# Patient Record
Sex: Female | Born: 1985 | Race: White | Hispanic: Yes | Marital: Single | Smoking: Never smoker
Health system: Southern US, Community
[De-identification: ages and names within clinical notes are randomized; demographics above are authoritative.]

## PROBLEM LIST (undated history)

## (undated) ENCOUNTER — Inpatient Hospital Stay (HOSPITAL_COMMUNITY): Payer: Self-pay

## (undated) DIAGNOSIS — B019 Varicella without complication: Secondary | ICD-10-CM

## (undated) DIAGNOSIS — N390 Urinary tract infection, site not specified: Secondary | ICD-10-CM

## (undated) DIAGNOSIS — Z789 Other specified health status: Secondary | ICD-10-CM

## (undated) HISTORY — PX: NO PAST SURGERIES: SHX2092

---

## 2010-10-20 ENCOUNTER — Emergency Department (HOSPITAL_COMMUNITY)
Admission: EM | Admit: 2010-10-20 | Discharge: 2010-10-20 | Payer: Self-pay | Source: Home / Self Care | Admitting: Family Medicine

## 2010-11-19 ENCOUNTER — Emergency Department (HOSPITAL_COMMUNITY): Admission: EM | Admit: 2010-11-19 | Discharge: 2010-11-19 | Payer: Self-pay | Admitting: Family Medicine

## 2011-03-05 LAB — POCT URINALYSIS DIPSTICK
Bilirubin Urine: NEGATIVE
Glucose, UA: NEGATIVE mg/dL
Nitrite: NEGATIVE
Urobilinogen, UA: 0.2 mg/dL (ref 0.0–1.0)

## 2011-03-05 LAB — POCT PREGNANCY, URINE: Preg Test, Ur: NEGATIVE

## 2011-03-06 LAB — POCT URINALYSIS DIPSTICK
Bilirubin Urine: NEGATIVE
Glucose, UA: NEGATIVE mg/dL
Nitrite: NEGATIVE
Urobilinogen, UA: 0.2 mg/dL (ref 0.0–1.0)

## 2011-03-06 LAB — GC/CHLAMYDIA PROBE AMP, GENITAL
Chlamydia, DNA Probe: NEGATIVE
GC Probe Amp, Genital: NEGATIVE

## 2011-03-06 LAB — POCT PREGNANCY, URINE: Preg Test, Ur: NEGATIVE

## 2011-06-30 ENCOUNTER — Inpatient Hospital Stay (HOSPITAL_COMMUNITY)
Admission: AD | Admit: 2011-06-30 | Discharge: 2011-06-30 | Disposition: A | Discharge status: Home / Self Care | Payer: Self-pay | Source: Ambulatory Visit | Attending: Obstetrics & Gynecology | Admitting: Obstetrics & Gynecology

## 2011-06-30 ENCOUNTER — Inpatient Hospital Stay (HOSPITAL_COMMUNITY): Payer: Self-pay | Admitting: Obstetrics & Gynecology

## 2011-06-30 ENCOUNTER — Inpatient Hospital Stay (HOSPITAL_COMMUNITY): Payer: Self-pay

## 2011-06-30 ENCOUNTER — Encounter (HOSPITAL_COMMUNITY): Payer: Self-pay

## 2011-06-30 DIAGNOSIS — O209 Hemorrhage in early pregnancy, unspecified: Secondary | ICD-10-CM

## 2011-06-30 DIAGNOSIS — O200 Threatened abortion: Secondary | ICD-10-CM | POA: Insufficient documentation

## 2011-06-30 HISTORY — DX: Other specified health status: Z78.9

## 2011-06-30 LAB — URINALYSIS, ROUTINE W REFLEX MICROSCOPIC
Glucose, UA: NEGATIVE mg/dL
Leukocytes, UA: NEGATIVE
Protein, ur: NEGATIVE mg/dL
Specific Gravity, Urine: 1.01 (ref 1.005–1.030)

## 2011-06-30 LAB — URINE MICROSCOPIC-ADD ON

## 2011-06-30 LAB — CBC
MCH: 28.2 pg (ref 26.0–34.0)
MCV: 82.2 fL (ref 78.0–100.0)
Platelets: 277 10*3/uL (ref 150–400)
RBC: 4.65 MIL/uL (ref 3.87–5.11)
RDW: 14.6 % (ref 11.5–15.5)
WBC: 8.6 10*3/uL (ref 4.0–10.5)

## 2011-06-30 LAB — HCG, QUANTITATIVE, PREGNANCY: hCG, Beta Chain, Quant, S: 10027 m[IU]/mL — ABNORMAL HIGH (ref <5)

## 2011-06-30 LAB — WET PREP, GENITAL
Clue Cells Wet Prep HPF POC: NONE SEEN
Trich, Wet Prep: NONE SEEN

## 2011-06-30 LAB — TYPE AND SCREEN: Antibody Screen: NEGATIVE

## 2011-06-30 NOTE — Progress Notes (Signed)
Mayer Camel, NP at bedside.  US done at bedside per NP.  See orders.

## 2011-06-30 NOTE — Progress Notes (Signed)
Pt given discharge instructions with interpreter at bedside.  Instructed to return on 07/02/2011 for repeat lab work.  Pt verbalized understanding.

## 2011-06-30 NOTE — ED Provider Notes (Signed)
History     Chief Complaint  Patient presents with  . Vaginal Bleeding    onset last night pink today red, wearing a pad   Patient is a 25 y.o. female presenting with vaginal bleeding. The history is provided by the patient. The history is limited by a language barrier. A language interpreter was used.  Vaginal Bleeding This is a new problem. The current episode started today. The problem occurs intermittently. The problem has been unchanged. Associated symptoms include abdominal pain. Pertinent negatives include no chills, coughing, fever, headaches, nausea or vomiting. The symptoms are aggravated by nothing. She has tried nothing for the symptoms.      Past Medical History  Diagnosis Date  . No pertinent past medical history     Past Surgical History  Procedure Date  . No past surgeries     No family history on file.  History  Substance Use Topics  . Smoking status: Never Smoker   . Smokeless tobacco: Not on file  . Alcohol Use: No    Allergies: No Known Allergies  Prescriptions prior to admission  Medication Sig Dispense Refill  . prenatal vitamin w/FE, FA (PRENATAL 1 + 1) 27-1 MG TABS Take 1 tablet by mouth daily.          Review of Systems  Constitutional: Negative for fever and chills.  Respiratory: Negative for cough.   Gastrointestinal: Positive for abdominal pain. Negative for nausea and vomiting.  Genitourinary: Positive for vaginal bleeding.       Vaginal bleeding  Neurological: Negative for headaches.   Physical Exam   Blood pressure 115/70, pulse 74, temperature 99.3 F (37.4 C), temperature source Oral, resp. rate 16, height 4\' 11"  (1.499 m), weight 109 lb (49.442 kg), last menstrual period 04/12/2011.  Physical Exam  Constitutional: She appears well-developed and well-nourished.  HENT:  Head: Normocephalic.  Neck: Neck supple.  GI: Soft. There is no tenderness.  Genitourinary: Uterus is not enlarged. Cervix exhibits no motion tenderness.  Right adnexum displays no tenderness. There is bleeding around the vagina.  Musculoskeletal: Normal range of motion.    MAU Course  Procedures  MDM Discussed with patient ultrasound results and prob. failed pregnancy. This is a much wanted pregnancy and patient request f/u [facility] and expectant management. Pt. To return in 2 days.. She will return sooner for heavy bleeding, severe pain or other problems.    [place], [place] 06/30/11 2207

## 2011-06-30 NOTE — Progress Notes (Signed)
Onset of pink bleeding last night and today red today has not changed a pad, earlier had some side pain, none at present LMP 04/12/11

## 2011-06-30 NOTE — Progress Notes (Signed)
Interpretor at bedside.  poc discussed. Pt verbalized understanding.

## 2011-06-30 NOTE — Progress Notes (Signed)
Mayer Camel, NP in with pt.  poc discussed.  Pelvic done.  Wet prep and cultures collected.

## 2011-06-30 NOTE — Initial Assessments (Signed)
Pt presents with complaint of onset of vaginal bleeding yesterday. States she did not have a period in April or May and had positive preg test at home in May. States she is changing a pad q 30 minutes, denies pain.

## 2011-07-01 LAB — GC/CHLAMYDIA PROBE AMP, GENITAL
Chlamydia, DNA Probe: NEGATIVE
GC Probe Amp, Genital: NEGATIVE

## 2011-07-02 ENCOUNTER — Inpatient Hospital Stay (HOSPITAL_COMMUNITY)
Admission: AD | Admit: 2011-07-02 | Discharge: 2011-07-02 | Disposition: A | Discharge status: Home / Self Care | Payer: Self-pay | Source: Ambulatory Visit | Attending: Obstetrics & Gynecology | Admitting: Obstetrics & Gynecology

## 2011-07-02 ENCOUNTER — Inpatient Hospital Stay (HOSPITAL_COMMUNITY): Admission: AD | Admit: 2011-07-02 | Payer: Self-pay | Source: Ambulatory Visit | Admitting: Obstetrics & Gynecology

## 2011-07-02 ENCOUNTER — Inpatient Hospital Stay (HOSPITAL_COMMUNITY): Payer: Self-pay

## 2011-07-02 ENCOUNTER — Encounter (HOSPITAL_COMMUNITY): Payer: Self-pay | Admitting: *Deleted

## 2011-07-02 DIAGNOSIS — O039 Complete or unspecified spontaneous abortion without complication: Secondary | ICD-10-CM | POA: Insufficient documentation

## 2011-07-02 LAB — DIFFERENTIAL
Basophils Absolute: 0.1 10*3/uL (ref 0.0–0.1)
Basophils Relative: 0 % (ref 0–1)
Eosinophils Absolute: 0.3 10*3/uL (ref 0.0–0.7)
Neutro Abs: 7.7 10*3/uL (ref 1.7–7.7)
Neutrophils Relative %: 69 % (ref 43–77)

## 2011-07-02 LAB — CBC
MCH: 28.2 pg (ref 26.0–34.0)
MCHC: 33.8 g/dL (ref 30.0–36.0)
Platelets: 267 10*3/uL (ref 150–400)

## 2011-07-02 LAB — HCG, QUANTITATIVE, PREGNANCY: hCG, Beta Chain, Quant, S: 8244 m[IU]/mL — ABNORMAL HIGH (ref <5)

## 2011-07-02 MED ORDER — HYDROCODONE-ACETAMINOPHEN 5-500 MG PO TABS: 1.0000 | ORAL_TABLET | Freq: Four times a day (QID) | ORAL | Status: AC | PRN

## 2011-07-02 MED ORDER — METHYLERGONOVINE MALEATE 0.2 MG/ML IJ SOLN
0.2000 mg | Freq: Once | INTRAMUSCULAR | Status: AC
  Administered 2011-07-02: 0.2 mg via INTRAMUSCULAR
  Filled 2011-07-02: qty 1

## 2011-07-02 MED ORDER — HYDROMORPHONE HCL 1 MG/ML IJ SOLN
2.0000 mg | Freq: Once | INTRAMUSCULAR | Status: AC
  Administered 2011-07-02: 2 mg via INTRAMUSCULAR
  Filled 2011-07-02: qty 2

## 2011-07-02 MED ORDER — LACTATED RINGERS IV SOLN
Freq: Once | INTRAVENOUS | Status: AC
  Administered 2011-07-02: 10:00:00 via INTRAVENOUS

## 2011-07-02 NOTE — Progress Notes (Signed)
07/02/2011 Hillery Zachman  Interpreter  I assisted Darl Pikes RN with interpretation of plan of care

## 2011-07-02 NOTE — ED Notes (Signed)
Pt. Became dizzy and somewhat syncopal in U/S. Was returned to MAU on stretcher. Was conscious and oriented. Bleeding was minimal. E. Rice, PA observed pt. In room.

## 2011-07-02 NOTE — Progress Notes (Signed)
Bleeding started 4 days ago, became heavy today. Cramping started today.

## 2011-07-02 NOTE — Progress Notes (Signed)
07/02/2011 Amber Jacobson  Interpreter   I assisted Darl Pikes RN with patient discharge.

## 2011-07-02 NOTE — ED Provider Notes (Signed)
History   Pt is a 25y.o hispanic female who presents this am c/o severe lower abd pain and heavy vag bleeding that began around 9am. She denies fever or any other sx at this time. Pt was seen on 06/30/11 for mild vag bleeding and was noted at that time to have a probable failed pregnancy.  No chief complaint on file.  HPI  OB History    Grav Para Term Preterm Abortions TAB SAB Ect Mult Living   1               Past Medical History  Diagnosis Date  . No pertinent past medical history     Past Surgical History  Procedure Date  . No past surgeries     No family history on file.  History  Substance Use Topics  . Smoking status: Never Smoker   . Smokeless tobacco: Not on file  . Alcohol Use: No    Allergies: No Known Allergies  Prescriptions prior to admission  Medication Sig Dispense Refill  . prenatal vitamin w/FE, FA (PRENATAL 1 + 1) 27-1 MG TABS Take 1 tablet by mouth daily.          Review of Systems  Constitutional: Negative for fever and chills.  Cardiovascular: Negative for chest pain.  Gastrointestinal: Positive for abdominal pain. Negative for nausea and vomiting.  Genitourinary: Negative for dysuria, urgency, frequency and hematuria.  Neurological: Negative.  Negative for headaches.  Psychiatric/Behavioral: Negative for depression and suicidal ideas.   Physical Exam   Blood pressure 136/92, pulse 103, temperature 98.4 F (36.9 C), resp. rate 20, last menstrual period 04/12/2011.  Physical Exam  Nursing note and vitals reviewed. Constitutional: She appears well-developed and well-nourished. She appears distressed.  GI: Soft. She exhibits no distension. There is tenderness. There is no rebound and no guarding.  Genitourinary: There is bleeding (Heavy vag bleeding noted on exam.) around the vagina. No vaginal discharge found.    MAU Course  Procedures  [place] reviewed and demonstrates no IUP, gestational sac, of POC. Pt appears to have a completed AB  Pt  was given an IM injection of methergine.  A/P: 1) Complete AB: pt will be dc'd to home with an Rx for lortab. She will f/u in the gyn clinic. The interpreter has given pt all instructions. Discussed diet, activity, risks, and precautions.    Amber Jacobson, [place] 07/02/11 1205

## 2011-07-02 NOTE — ED Notes (Signed)
Pt. Brought to room #7. E. Rice, PA in to assess pain and bleeding.

## 2011-07-02 NOTE — ED Notes (Signed)
Jenean Lindau, PA at bedside. Minimal bleeding since arrival.

## 2011-07-02 NOTE — ED Notes (Signed)
Peripad changed prior to transport to U/S. Small amt,. Bleeding, but active trickle noted.

## 2011-07-24 ENCOUNTER — Encounter: Payer: Self-pay | Admitting: Advanced Practice Midwife

## 2011-12-24 NOTE — L&D Delivery Note (Signed)
Delivery Note At 11:08 PM a viable female was delivered via Vaginal, Spontaneous Delivery (Presentation: Left Occiput Anterior).  APGAR: 8, 9; weight 6 lb 10.9 oz (3030 g).   Placenta status: delivered with cord traction; membrane fragments retrieved from the cervix/LUS.  Cord: 3 vessels with the following complications: None.    Anesthesia: Epidural, local  Episiotomy: None Lacerations: 2nd degree;Perineal Suture Repair: 2.0 3.0 vicryl vicryl rapide Est. Blood Loss (mL): 300 ml  Mom to postpartum.  Baby to nursery-stable.  JACKSON-MOORE,Linetta Regner A 07/19/2012, 11:38 PM

## 2012-01-28 LAB — OB RESULTS CONSOLE HEPATITIS B SURFACE ANTIGEN: Hepatitis B Surface Ag: NEGATIVE

## 2012-01-28 LAB — OB RESULTS CONSOLE ABO/RH: RH Type: POSITIVE

## 2012-01-28 LAB — OB RESULTS CONSOLE RUBELLA ANTIBODY, IGM: Rubella: IMMUNE

## 2012-01-28 LAB — OB RESULTS CONSOLE ANTIBODY SCREEN: Antibody Screen: NEGATIVE

## 2012-01-28 LAB — OB RESULTS CONSOLE GC/CHLAMYDIA: Chlamydia: NEGATIVE

## 2012-07-19 ENCOUNTER — Inpatient Hospital Stay (HOSPITAL_COMMUNITY)
Admission: AD | Admit: 2012-07-19 | Discharge: 2012-07-21 | DRG: 775 | Disposition: A | Discharge status: Home / Self Care | Payer: Medicaid Other | Source: Ambulatory Visit | Attending: Obstetrics | Admitting: Obstetrics

## 2012-07-19 ENCOUNTER — Inpatient Hospital Stay (HOSPITAL_COMMUNITY): Payer: Medicaid Other | Admitting: Anesthesiology

## 2012-07-19 ENCOUNTER — Encounter (HOSPITAL_COMMUNITY): Payer: Self-pay | Admitting: *Deleted

## 2012-07-19 ENCOUNTER — Encounter (HOSPITAL_COMMUNITY): Payer: Self-pay | Admitting: Anesthesiology

## 2012-07-19 ENCOUNTER — Encounter (HOSPITAL_COMMUNITY): Payer: Self-pay

## 2012-07-19 DIAGNOSIS — O139 Gestational [pregnancy-induced] hypertension without significant proteinuria, unspecified trimester: Principal | ICD-10-CM | POA: Diagnosis present

## 2012-07-19 HISTORY — DX: Urinary tract infection, site not specified: N39.0

## 2012-07-19 HISTORY — DX: Varicella without complication: B01.9

## 2012-07-19 LAB — COMPREHENSIVE METABOLIC PANEL
Albumin: 2.5 g/dL — ABNORMAL LOW (ref 3.5–5.2)
Alkaline Phosphatase: 248 U/L — ABNORMAL HIGH (ref 39–117)
BUN: 10 mg/dL (ref 6–23)
CO2: 20 mEq/L (ref 19–32)
Chloride: 102 mEq/L (ref 96–112)
Glucose, Bld: 84 mg/dL (ref 70–99)
Potassium: 4.1 mEq/L (ref 3.5–5.1)
Total Bilirubin: 0.7 mg/dL (ref 0.3–1.2)

## 2012-07-19 LAB — RPR: RPR Ser Ql: NONREACTIVE

## 2012-07-19 LAB — LACTATE DEHYDROGENASE: LDH: 222 U/L (ref 94–250)

## 2012-07-19 LAB — CBC
Hemoglobin: 9.3 g/dL — ABNORMAL LOW (ref 12.0–15.0)
MCH: 20.7 pg — ABNORMAL LOW (ref 26.0–34.0)
MCHC: 29.5 g/dL — ABNORMAL LOW (ref 30.0–36.0)

## 2012-07-19 LAB — POCT FERN TEST

## 2012-07-19 LAB — ABO/RH: ABO/RH(D): O POS

## 2012-07-19 LAB — PREPARE RBC (CROSSMATCH)

## 2012-07-19 MED ORDER — LACTATED RINGERS IV SOLN
500.0000 mL | INTRAVENOUS | Status: DC | PRN
Start: 2012-07-19 — End: 2012-07-20
  Administered 2012-07-19: 500 mL via INTRAVENOUS

## 2012-07-19 MED ORDER — PHENYLEPHRINE 40 MCG/ML (10ML) SYRINGE FOR IV PUSH (FOR BLOOD PRESSURE SUPPORT): 80.0000 ug | PREFILLED_SYRINGE | INTRAVENOUS | Status: DC | PRN

## 2012-07-19 MED ORDER — LACTATED RINGERS IV SOLN: 500.0000 mL | Freq: Once | INTRAVENOUS | Status: DC

## 2012-07-19 MED ORDER — LIDOCAINE HCL (PF) 1 % IJ SOLN
30.0000 mL | INTRAMUSCULAR | Status: DC | PRN
  Administered 2012-07-19: 30 mL via SUBCUTANEOUS
  Filled 2012-07-19: qty 30

## 2012-07-19 MED ORDER — PHENYLEPHRINE 40 MCG/ML (10ML) SYRINGE FOR IV PUSH (FOR BLOOD PRESSURE SUPPORT)
80.0000 ug | PREFILLED_SYRINGE | INTRAVENOUS | Status: DC | PRN
  Filled 2012-07-19: qty 5

## 2012-07-19 MED ORDER — EPHEDRINE 5 MG/ML INJ
10.0000 mg | INTRAVENOUS | Status: DC | PRN
  Filled 2012-07-19: qty 4

## 2012-07-19 MED ORDER — IBUPROFEN 600 MG PO TABS
600.0000 mg | ORAL_TABLET | Freq: Four times a day (QID) | ORAL | Status: DC | PRN
  Administered 2012-07-20: 600 mg via ORAL
  Filled 2012-07-19: qty 1

## 2012-07-19 MED ORDER — LIDOCAINE HCL (PF) 1 % IJ SOLN
INTRAMUSCULAR | Status: DC | PRN
  Administered 2012-07-19 (×2): 8 mL

## 2012-07-19 MED ORDER — ACETAMINOPHEN 325 MG PO TABS: 650.0000 mg | ORAL_TABLET | ORAL | Status: DC | PRN

## 2012-07-19 MED ORDER — OXYCODONE-ACETAMINOPHEN 5-325 MG PO TABS: 1.0000 | ORAL_TABLET | ORAL | Status: DC | PRN

## 2012-07-19 MED ORDER — DIPHENHYDRAMINE HCL 50 MG/ML IJ SOLN: 12.5000 mg | INTRAMUSCULAR | Status: DC | PRN

## 2012-07-19 MED ORDER — LACTATED RINGERS IV SOLN
INTRAVENOUS | Status: DC
  Administered 2012-07-19 (×2): via INTRAVENOUS
  Administered 2012-07-19: 125 mL/h via INTRAVENOUS

## 2012-07-19 MED ORDER — BUTORPHANOL TARTRATE 1 MG/ML IJ SOLN: 1.0000 mg | INTRAMUSCULAR | Status: DC | PRN

## 2012-07-19 MED ORDER — FLEET ENEMA 7-19 GM/118ML RE ENEM: 1.0000 | ENEMA | RECTAL | Status: DC | PRN

## 2012-07-19 MED ORDER — OXYTOCIN BOLUS FROM INFUSION
250.0000 mL | Freq: Once | INTRAVENOUS | Status: DC
  Filled 2012-07-19: qty 500

## 2012-07-19 MED ORDER — TERBUTALINE SULFATE 1 MG/ML IJ SOLN: 0.2500 mg | Freq: Once | INTRAMUSCULAR | Status: AC | PRN

## 2012-07-19 MED ORDER — ONDANSETRON HCL 4 MG/2ML IJ SOLN: 4.0000 mg | Freq: Four times a day (QID) | INTRAMUSCULAR | Status: DC | PRN

## 2012-07-19 MED ORDER — EPHEDRINE 5 MG/ML INJ: 10.0000 mg | INTRAVENOUS | Status: DC | PRN

## 2012-07-19 MED ORDER — OXYTOCIN 40 UNITS IN LACTATED RINGERS INFUSION - SIMPLE MED
62.5000 mL/h | Freq: Once | INTRAVENOUS | Status: AC
  Administered 2012-07-19: 62.5 mL/h via INTRAVENOUS

## 2012-07-19 MED ORDER — NITROFURANTOIN MONOHYD MACRO 100 MG PO CAPS
100.0000 mg | ORAL_CAPSULE | Freq: Two times a day (BID) | ORAL | Status: DC
  Administered 2012-07-19: 100 mg via ORAL
  Filled 2012-07-19 (×3): qty 1

## 2012-07-19 MED ORDER — FENTANYL 2.5 MCG/ML BUPIVACAINE 1/10 % EPIDURAL INFUSION (WH - ANES)
INTRAMUSCULAR | Status: DC | PRN
  Administered 2012-07-19: 14 mL/h via EPIDURAL
  Administered 2012-07-19: 14 mL/h

## 2012-07-19 MED ORDER — OXYTOCIN 40 UNITS IN LACTATED RINGERS INFUSION - SIMPLE MED
1.0000 m[IU]/min | INTRAVENOUS | Status: DC
  Administered 2012-07-19: 2 m[IU]/min via INTRAVENOUS
  Filled 2012-07-19: qty 1000

## 2012-07-19 MED ORDER — CITRIC ACID-SODIUM CITRATE 334-500 MG/5ML PO SOLN: 30.0000 mL | ORAL | Status: DC | PRN

## 2012-07-19 MED ORDER — FENTANYL 2.5 MCG/ML BUPIVACAINE 1/10 % EPIDURAL INFUSION (WH - ANES)
14.0000 mL/h | INTRAMUSCULAR | Status: DC
  Filled 2012-07-19 (×2): qty 60

## 2012-07-19 NOTE — Progress Notes (Addendum)
Amber Jacobson is a 26 y.o. G2P0010 at [redacted]w[redacted]d by LMP admitted for active labor, PIH  Subjective: Comfortable  Objective: BP 132/85  Pulse 92  Temp 97.8 F (36.6 C) (Oral)  Resp 20  Ht 4\' 11"  (1.499 m)  Wt 71.668 kg (158 lb)  BMI 31.91 kg/m2  SpO2 99%  LMP 04/12/2011      FHT:  FHR: 140 bpm, variability: moderate,  accelerations:  Present,  decelerations:  Absent UC:   regular, every 5 minutes SVE:   Dilation: 6.5 Effacement (%): 80 Station: -1 Exam by:: dr. Enrigue Catena IUPC placed  Labs: Lab Results  Component Value Date   WBC 10.2 07/19/2012   HGB 9.3* 07/19/2012   HCT 31.5* 07/19/2012   MCV 70.2* 07/19/2012   PLT 411* 07/19/2012    Assessment / Plan: Protracted active phase  Labor: Will monitor MVU/titrate Pitocin-->300 MVU Preeclampsia:  BPs OK Fetal Wellbeing:  Category I Pain Control:  Epidural I/D:  n/a Anticipated MOD:  NSVD  JACKSON-MOORE,Dareen Gutzwiller A 07/19/2012, 5:26 PM

## 2012-07-19 NOTE — H&P (Signed)
Amber Jacobson is a 26 y.o. female presenting for contractions. Maternal Medical History:  Reason for admission: Reason for admission: contractions.  Contractions: Frequency: regular.   Perceived severity is moderate.    Fetal activity: Perceived fetal activity is normal.    Prenatal complications: no prenatal complications   OB History    Grav Para Term Preterm Abortions TAB SAB Ect Mult Living   2    1  1         Past Medical History  Diagnosis Date  . No pertinent past medical history   . Varicella   . UTI (lower urinary tract infection)    Past Surgical History  Procedure Date  . No past surgeries    Family History: family history is not on file. Social History:  reports that she has never smoked. She does not have any smokeless tobacco history on file. She reports that she does not drink alcohol or use illicit drugs.     Review of Systems  Constitutional: Negative for fever.  Eyes: Negative for blurred vision.  Respiratory: Negative for shortness of breath.   Gastrointestinal: Negative for vomiting.  Skin: Negative for rash.  Neurological: Negative for headaches.    Dilation: 1.5 Effacement (%): 50 Station: -2 Exam by:: Peace, rn Blood pressure 127/80, pulse 88, temperature 97.9 F (36.6 C), temperature source Oral, resp. rate 18, height 4\' 11"  (1.499 m), weight 71.668 kg (158 lb), last menstrual period 04/12/2011. Maternal Exam:  Uterine Assessment: Contraction frequency is irregular.   Abdomen: Patient reports no abdominal tenderness. Fetal presentation: vertex  Introitus: not evaluated.   Cervix: Cervix evaluated by digital exam.     Fetal Exam Fetal Monitor Review: Variability: moderate (6-25 bpm).   Pattern: no decelerations.    Fetal State Assessment: Category I - tracings are normal.     Physical Exam  Constitutional: She appears well-developed.  HENT:  Head: Normocephalic.  Neck: Neck supple. No thyromegaly present.    Cardiovascular: Normal rate and regular rhythm.   Respiratory: Breath sounds normal.  GI: Soft. Bowel sounds are normal.  Skin: No rash noted.    Prenatal labs: ABO, Rh: --/--/O POS, O POS (07/28 0710) Antibody: NEG (07/28 0710) Rubella: Immune (02/05 0000) RPR: Nonreactive (02/05 0000)  HBsAg: Negative (02/05 0000)  HIV: Non-reactive (02/05 0000)  GBS: Negative (07/09 0000)   Assessment/Plan: [place] @ [redacted]w[redacted]d.  Prodromal labor.  Gestational hypertension.  Admit PIH labs Likely candidate for augmentation of labor with low dose Pitocin per protocol   JACKSON-MOORE,Marijose Curington A 07/19/2012, 11:02 AM

## 2012-07-19 NOTE — Anesthesia Procedure Notes (Signed)
Epidural Patient location during procedure: OB Start time: 07/19/2012 4:34 PM End time: 07/19/2012 4:39 PM Reason for block: procedure for pain  Staffing Anesthesiologist: Sandrea Hughs Performed by: anesthesiologist   Preanesthetic Checklist Completed: patient identified, site marked, surgical consent, pre-op evaluation, timeout performed, IV checked, risks and benefits discussed and monitors and equipment checked  Epidural Patient position: sitting Prep: site prepped and draped and DuraPrep Patient monitoring: continuous pulse ox and blood pressure Approach: midline Injection technique: LOR air  Needle:  Needle type: Tuohy  Needle gauge: 17 G Needle length: 9 cm Needle insertion depth: 5 cm cm Catheter type: closed end flexible Catheter size: 19 Gauge Catheter at skin depth: 10 cm Test dose: negative and Other  Assessment Sensory level: T8 Events: blood not aspirated, injection not painful, no injection resistance, negative IV test and no paresthesia

## 2012-07-19 NOTE — Progress Notes (Signed)
Called Dr. Tamela Oddi to inform her about patient Bp readings. Order to admit to l/d

## 2012-07-19 NOTE — Progress Notes (Signed)
[facility] - interpreter @ bedside

## 2012-07-19 NOTE — Anesthesia Preprocedure Evaluation (Signed)
Anesthesia Evaluation  Patient identified by MRN, date of birth, ID band Patient awake    Reviewed: Allergy & Precautions, H&P , NPO status , Patient's Chart, lab work & pertinent test results  Airway Mallampati: II TM Distance: >3 FB Neck ROM: full    Dental No notable dental hx.    Pulmonary neg pulmonary ROS,    Pulmonary exam normal       Cardiovascular     Neuro/Psych negative neurological ROS  negative psych ROS   GI/Hepatic negative GI ROS, Neg liver ROS,   Endo/Other  negative endocrine ROS  Renal/GU negative Renal ROS  negative genitourinary   Musculoskeletal negative musculoskeletal ROS (+)   Abdominal Normal abdominal exam  (+)   Peds negative pediatric ROS (+)  Hematology negative hematology ROS (+)   Anesthesia Other Findings   Reproductive/Obstetrics (+) Pregnancy                           Anesthesia Physical Anesthesia Plan  ASA: II  Anesthesia Plan: Epidural   Post-op Pain Management:    Induction:   Airway Management Planned:   Additional Equipment:   Intra-op Plan:   Post-operative Plan:   Informed Consent: I have reviewed the patients History and Physical, chart, labs and discussed the procedure including the risks, benefits and alternatives for the proposed anesthesia with the patient or authorized representative who has indicated his/her understanding and acceptance.     Plan Discussed with:   Anesthesia Plan Comments:         Anesthesia Quick Evaluation  

## 2012-07-19 NOTE — Progress Notes (Signed)
Dr Tamela Oddi notified of patient, tracing, ctx pattern, sve results. Order to discharge patient home with labor precautions.

## 2012-07-20 LAB — HEMOGLOBIN AND HEMATOCRIT, BLOOD
HCT: 29.1 % — ABNORMAL LOW (ref 36.0–46.0)
Hemoglobin: 8.6 g/dL — ABNORMAL LOW (ref 12.0–15.0)

## 2012-07-20 MED ORDER — OXYCODONE-ACETAMINOPHEN 5-325 MG PO TABS: 1.0000 | ORAL_TABLET | ORAL | Status: DC | PRN

## 2012-07-20 MED ORDER — BENZOCAINE-MENTHOL 20-0.5 % EX AERO
1.0000 "application " | INHALATION_SPRAY | CUTANEOUS | Status: DC | PRN
  Administered 2012-07-21: 1 via TOPICAL
  Filled 2012-07-20: qty 56

## 2012-07-20 MED ORDER — MEDROXYPROGESTERONE ACETATE 150 MG/ML IM SUSP: 150.0000 mg | INTRAMUSCULAR | Status: DC | PRN

## 2012-07-20 MED ORDER — MAGNESIUM HYDROXIDE 400 MG/5ML PO SUSP: 30.0000 mL | ORAL | Status: DC | PRN

## 2012-07-20 MED ORDER — ZOLPIDEM TARTRATE 5 MG PO TABS: 5.0000 mg | ORAL_TABLET | Freq: Every evening | ORAL | Status: DC | PRN

## 2012-07-20 MED ORDER — PRENATAL MULTIVITAMIN CH
1.0000 | ORAL_TABLET | Freq: Every day | ORAL | Status: DC
  Administered 2012-07-20 – 2012-07-21 (×2): 1 via ORAL
  Filled 2012-07-20 (×2): qty 1

## 2012-07-20 MED ORDER — LANOLIN HYDROUS EX OINT: TOPICAL_OINTMENT | CUTANEOUS | Status: DC | PRN

## 2012-07-20 MED ORDER — ONDANSETRON HCL 4 MG/2ML IJ SOLN: 4.0000 mg | INTRAMUSCULAR | Status: DC | PRN

## 2012-07-20 MED ORDER — FERROUS SULFATE 325 (65 FE) MG PO TABS
325.0000 mg | ORAL_TABLET | Freq: Two times a day (BID) | ORAL | Status: DC
  Administered 2012-07-20 – 2012-07-21 (×3): 325 mg via ORAL
  Filled 2012-07-20 (×3): qty 1

## 2012-07-20 MED ORDER — IBUPROFEN 600 MG PO TABS
600.0000 mg | ORAL_TABLET | Freq: Four times a day (QID) | ORAL | Status: DC
  Administered 2012-07-20 – 2012-07-21 (×5): 600 mg via ORAL
  Filled 2012-07-20 (×5): qty 1

## 2012-07-20 MED ORDER — MEASLES, MUMPS & RUBELLA VAC ~~LOC~~ INJ
0.5000 mL | INJECTION | Freq: Once | SUBCUTANEOUS | Status: DC
  Filled 2012-07-20: qty 0.5

## 2012-07-20 MED ORDER — TETANUS-DIPHTH-ACELL PERTUSSIS 5-2.5-18.5 LF-MCG/0.5 IM SUSP
0.5000 mL | Freq: Once | INTRAMUSCULAR | Status: AC
  Administered 2012-07-20: 0.5 mL via INTRAMUSCULAR
  Filled 2012-07-20: qty 0.5

## 2012-07-20 MED ORDER — ONDANSETRON HCL 4 MG PO TABS: 4.0000 mg | ORAL_TABLET | ORAL | Status: DC | PRN

## 2012-07-20 MED ORDER — DIPHENHYDRAMINE HCL 25 MG PO CAPS: 25.0000 mg | ORAL_CAPSULE | Freq: Four times a day (QID) | ORAL | Status: DC | PRN

## 2012-07-20 MED ORDER — WITCH HAZEL-GLYCERIN EX PADS: 1.0000 "application " | MEDICATED_PAD | CUTANEOUS | Status: DC | PRN

## 2012-07-20 MED ORDER — DIBUCAINE 1 % RE OINT: 1.0000 "application " | TOPICAL_OINTMENT | RECTAL | Status: DC | PRN

## 2012-07-20 MED ORDER — SENNOSIDES-DOCUSATE SODIUM 8.6-50 MG PO TABS
2.0000 | ORAL_TABLET | Freq: Every day | ORAL | Status: DC
  Administered 2012-07-20: 2 via ORAL

## 2012-07-20 NOTE — Anesthesia Postprocedure Evaluation (Signed)
  Anesthesia Post-op Note  Patient: Amber Jacobson  Procedure(s) Performed: * No procedures listed *  Patient Location: PACU and Mother/Baby  Anesthesia Type: Epidural  Level of Consciousness: awake, alert  and oriented  Airway and Oxygen Therapy: Patient Spontanous Breathing  Post-op Pain: none  Post-op Assessment: Post-op Vital signs reviewed and Patient's Cardiovascular Status Stable  Post-op Vital Signs: Reviewed and stable  Complications: No apparent anesthesia complications

## 2012-07-20 NOTE — Progress Notes (Signed)
Patient ID: Amber Jacobson, female   DOB: 1986-04-21, 26 y.o.   MRN: [id number] Postpartum day one Vital signs normal Fundus firm Lochia moderate Legs negative Doing well

## 2012-07-20 NOTE — Progress Notes (Signed)
UR chart review completed.  

## 2012-07-21 NOTE — Discharge Summary (Signed)
Obstetric Discharge Summary Reason for Admission: onset of labor Prenatal Procedures: none Intrapartum Procedures: spontaneous vaginal delivery Postpartum Procedures: none Complications-Operative and Postpartum: none Hemoglobin  Date Value Range Status  07/20/2012 8.6* 12.0 - 15.0 g/dL Final     HCT  Date Value Range Status  07/20/2012 29.1* 36.0 - 46.0 % Final    Physical Exam:  General: alert Lochia: appropriate Uterine Fundus: firm Incision: healing well DVT Evaluation: No evidence of DVT seen on physical exam.  Discharge Diagnoses: Term Pregnancy-delivered  Discharge Information: Date: 07/21/2012 Activity: pelvic rest Diet: routine Medications: Percocet Condition: stable Instructions: refer to practice specific booklet Discharge to: home Follow-up Information    Follow up with Jovoni Borkenhagen A, MD. Call in 6 weeks.   Contact information:   [address] [place] [address] [phone]          Newborn Data: Live born female  Birth Weight: 6 lb 10.9 oz (3030 g) APGAR: 8, 9  Home with mother.  Hulon Ferron A 07/21/2012, 6:50 AM

## 2012-07-22 ENCOUNTER — Other Ambulatory Visit: Payer: Self-pay | Admitting: Obstetrics

## 2012-07-22 LAB — TYPE AND SCREEN

## 2014-10-20 ENCOUNTER — Ambulatory Visit: Payer: Self-pay

## 2014-10-24 ENCOUNTER — Encounter (HOSPITAL_COMMUNITY): Payer: Self-pay | Admitting: *Deleted

## 2014-11-22 LAB — OB RESULTS CONSOLE GC/CHLAMYDIA
Chlamydia: NEGATIVE
Gonorrhea: NEGATIVE

## 2014-11-22 LAB — OB RESULTS CONSOLE HIV ANTIBODY (ROUTINE TESTING): HIV: NONREACTIVE

## 2014-11-22 LAB — OB RESULTS CONSOLE RPR: RPR: NONREACTIVE

## 2014-12-23 NOTE — L&D Delivery Note (Signed)
Delivery Note At 3:35 PM a viable female was delivered via Vaginal, Spontaneous Delivery (Presentation: Right Occiput Anterior).  APGAR: 8, 9; weight  .   Placenta status: Intact, Spontaneous.  Cord: 3 vessels with the following complications: None.  Cord pH: not done  Anesthesia: Epidural  Episiotomy: None Lacerations: 1st degree;Perineal Suture Repair: 2.0 vicryl Est. Blood Loss (mL): 250  Mom to postpartum.  Baby to Couplet care / Skin to Skin.  Javaria Knapke A 03/27/2015, 3:53 PM

## 2015-03-02 ENCOUNTER — Inpatient Hospital Stay (HOSPITAL_COMMUNITY)
Admission: AD | Admit: 2015-03-02 | Discharge: 2015-03-02 | Disposition: A | Discharge status: Home / Self Care | Payer: Medicaid Other | Source: Ambulatory Visit | Attending: Obstetrics | Admitting: Obstetrics

## 2015-03-02 ENCOUNTER — Encounter (HOSPITAL_COMMUNITY): Payer: Self-pay | Admitting: *Deleted

## 2015-03-02 DIAGNOSIS — Z3A36 36 weeks gestation of pregnancy: Secondary | ICD-10-CM | POA: Insufficient documentation

## 2015-03-02 DIAGNOSIS — J111 Influenza due to unidentified influenza virus with other respiratory manifestations: Secondary | ICD-10-CM | POA: Diagnosis not present

## 2015-03-02 DIAGNOSIS — O9989 Other specified diseases and conditions complicating pregnancy, childbirth and the puerperium: Secondary | ICD-10-CM | POA: Diagnosis not present

## 2015-03-02 HISTORY — DX: Other specified health status: Z78.9

## 2015-03-02 LAB — COMPREHENSIVE METABOLIC PANEL
ALK PHOS: 187 U/L — AB (ref 39–117)
ALT: 24 U/L (ref 0–35)
AST: 46 U/L — AB (ref 0–37)
Albumin: 3 g/dL — ABNORMAL LOW (ref 3.5–5.2)
Anion gap: 12 (ref 5–15)
BUN: 6 mg/dL (ref 6–23)
CALCIUM: 8.7 mg/dL (ref 8.4–10.5)
CHLORIDE: 103 mmol/L (ref 96–112)
CO2: 18 mmol/L — ABNORMAL LOW (ref 19–32)
Creatinine, Ser: 0.44 mg/dL — ABNORMAL LOW (ref 0.50–1.10)
GFR calc Af Amer: >90 mL/min (ref >90)
Glucose, Bld: 98 mg/dL (ref 70–99)
POTASSIUM: 4.3 mmol/L (ref 3.5–5.1)
Sodium: 133 mmol/L — ABNORMAL LOW (ref 135–145)
TOTAL PROTEIN: 7.1 g/dL (ref 6.0–8.3)
Total Bilirubin: 1.5 mg/dL — ABNORMAL HIGH (ref 0.3–1.2)

## 2015-03-02 LAB — CBC
HEMATOCRIT: 36.9 % (ref 36.0–46.0)
HEMOGLOBIN: 12.3 g/dL (ref 12.0–15.0)
MCH: 28.2 pg (ref 26.0–34.0)
MCHC: 33.3 g/dL (ref 30.0–36.0)
MCV: 84.6 fL (ref 78.0–100.0)
Platelets: 227 10*3/uL (ref 150–400)
RBC: 4.36 MIL/uL (ref 3.87–5.11)
RDW: 15.4 % (ref 11.5–15.5)
WBC: 10.6 10*3/uL — AB (ref 4.0–10.5)

## 2015-03-02 LAB — INFLUENZA PANEL BY PCR (TYPE A & B)
H1N1FLUPCR: DETECTED — AB
Influenza A By PCR: POSITIVE — AB
Influenza B By PCR: NEGATIVE

## 2015-03-02 MED ORDER — LACTATED RINGERS IV BOLUS (SEPSIS): 1000.0000 mL | Freq: Once | INTRAVENOUS | Status: DC

## 2015-03-02 MED ORDER — ACETAMINOPHEN 500 MG PO TABS
1000.0000 mg | ORAL_TABLET | Freq: Once | ORAL | Status: AC
  Administered 2015-03-02: 1000 mg via ORAL
  Filled 2015-03-02: qty 2

## 2015-03-02 MED ORDER — LACTATED RINGERS IV BOLUS (SEPSIS)
1000.0000 mL | Freq: Once | INTRAVENOUS | Status: AC
  Administered 2015-03-02: 1000 mL via INTRAVENOUS

## 2015-03-02 MED ORDER — OSELTAMIVIR PHOSPHATE 75 MG PO CAPS: 75.0000 mg | ORAL_CAPSULE | Freq: Two times a day (BID) | ORAL | Status: DC

## 2015-03-02 MED ORDER — OSELTAMIVIR PHOSPHATE 75 MG PO CAPS
75.0000 mg | ORAL_CAPSULE | Freq: Once | ORAL | Status: AC
Start: 2015-03-02 — End: 2015-03-02
  Administered 2015-03-02: 75 mg via ORAL
  Filled 2015-03-02: qty 1

## 2015-03-02 NOTE — Progress Notes (Signed)
Pt given discharge instructions through in translator.

## 2015-03-02 NOTE — Discharge Instructions (Signed)
Gripe (Influenza) La gripe es una infeccin viral del tracto respiratorio. Ocurre con ms frecuencia en los meses de invierno, ya que las personas pasan ms tiempo en contacto cercano. La gripe puede enfermarlo considerablemente. Se transmite fcilmente de una persona a otra (es contagiosa). CAUSAS  La causa es un virus que infecta el tracto respiratorio. Puede contagiarse el virus al aspirar las gotitas que una persona infectada elimina al toser o estornudar. Tambin puede contagiarse al tocar algo que fue recientemente contaminado con el virus y luego llevarse la mano a la boca, la nariz o los ojos. RIESGOS Y COMPLICACIONES Tendr mayor riesgo de sufrir un resfro grave si consume cigarrillos, es diabtico, sufre una enfermedad cardaca (como insuficiencia cardaca) o pulmonar crnica (como asma) o si tiene debilitado el sistema inmunolgico. Los ancianos y las mujeres embarazadas tienen ms riesgo de sufrir infecciones graves. El problema ms frecuente de la gripe es la infeccin pulmonar (neumona). En algunos casos, este problema puede requerir atencin mdica de emergencia y poner en peligro la vida. SIGNOS Y SNTOMAS  Los sntomas pueden durar entre 4 y 10 das y pueden ser:  Fiebre.  Escalofros.  Dolor de cabeza, dolores en el cuerpo y musculares.  Dolor de garganta.  Molestias en el pecho y tos.  Prdida del apetito.  Debilidad o cansancio.  Mareos.  Nuseas o vmitos. DIAGNSTICO  El diagnstico se realiza segn su historia clnica y un examen fsico. Es necesario realizar un anlisis de cultivo farngeo o nasal para confirmar el diagnstico. TRATAMIENTO  En los casos leves, la gripe se cura sin tratamiento. El tratamiento est dirigido a aliviar los sntomas. En los casos ms graves, el mdico podr recetar medicamentos antivirales para acortar el curso de la enfermedad. Los antibiticos no son eficaces, ya que la infeccin est causada por un virus y no una  bacteria. INSTRUCCIONES PARA EL CUIDADO EN EL HOGAR  Tome los medicamentos solamente como se lo haya indicado el mdico.  Utilice un humidificador de niebla fra para facilitar la respiracin.  Haga reposo hasta que la temperatura vuelva a ser normal. Generalmente esto lleva entre 3 y 4 das.  Beba suficiente lquido para mantener la orina clara o de color amarillo plido.  Cbrase la boca y la nariz al toser o estornudar, y lvese las manos muy bien para evitar que se propague el virus.  Qudese en su casa y no concurra al trabajo o a la escuela hasta que la fiebre haya desaparecido al menos por un da completo. PREVENCIN  La vacunacin anual contra la gripe es la mejor manera de evitar enfermarse. Se recomienda ahora de manera rutinaria una vacuna anual contra la gripe a todos los adultos estadounidenses. SOLICITE ATENCIN MDICA SI:  Tiene dolor en el pecho, la tos empeora o tiene ms mucosidad.  Tiene nuseas, vmitos o diarrea.  La fiebre regresa o empeora. SOLICITE ATENCIN MDICA DE INMEDIATO SI:   Tiene dificultad para respirar, le falta el aire o tiene la piel o las uas azuladas.  Presenta dolor intenso o entumecimiento en el cuello.  Le duele la cabeza de forma repentina o tiene dolor en la cara o el odo.  Tiene nuseas o vmitos que no puede controlar. ASEGRESE DE QUE:   Comprende estas instrucciones.  Controlar su afeccin.  Recibir ayuda de inmediato si no mejora o si empeora. Document Released: 09/18/2005 Document Revised: 04/25/2014 [employer] Patient Information 2015 [employer]. This information is not intended to replace advice given to you by your health care   provider. Make sure you discuss any questions you have with your health care provider.  

## 2015-03-02 NOTE — MAU Provider Note (Signed)
History     CSN: [id number]  Arrival date and time: 03/02/15 0020   First Provider Initiated Contact with Patient 03/02/15 0103      No chief complaint on file.  HPI  Amber Jacobson is a 29 y.o. G2P1011 at 36.0 weeks who presents today with fever and chills. She states that the fever started on 02/28/15 at 1900. She has also had a cough and sore throat. She states that she body aches. She denies any sick contacts, and she did not have a flu vaccine this year. She has had some "weak" contractions. She denies any VB or LOF. She states that she has had some discharge. She states that the fetus has been moving normally. She denies any pain with urination.   Past Medical History  Diagnosis Date  . No pertinent past medical history   . Varicella   . UTI (lower urinary tract infection)     Past Surgical History  Procedure Laterality Date  . No past surgeries      No family history on file.  History  Substance Use Topics  . Smoking status: Never Smoker   . Smokeless tobacco: Not on file  . Alcohol Use: No    Allergies: No Known Allergies  No prescriptions prior to admission    Review of Systems  Constitutional: Positive for fever and chills.  Eyes: Negative.   Respiratory: Positive for cough. Negative for shortness of breath and wheezing.   Cardiovascular: Negative.   Gastrointestinal: Negative for nausea, vomiting, abdominal pain, diarrhea and constipation.  Genitourinary: Negative for dysuria, urgency and frequency.  Musculoskeletal: Positive for myalgias.  Skin: Negative.   Neurological: Positive for headaches.  Endo/Heme/Allergies: Negative.   Psychiatric/Behavioral: Negative.    Physical Exam   unknown if currently breastfeeding.  Physical Exam  Nursing note and vitals reviewed. Constitutional: She is oriented to person, place, and time. She appears well-developed and well-nourished. No distress.  Cardiovascular: Normal rate.   Respiratory: Effort normal.  No respiratory distress. She has no wheezes.  GI: Soft. There is no tenderness. There is no rebound.  Genitourinary:  No CVA tenderness   Neurological: She is alert and oriented to person, place, and time.  Skin: Skin is warm and dry.  Psychiatric: She has a normal mood and affect.   FHT: 160, moderate with 15x15 accels, no decels Toco: irregular UCs MAU Course  Procedures 1 L LR bolus  1000 mg tylenol 75 mg tamilfu Nasal flu swab done CBC/CMET  2nd L of LR  Results for orders placed or performed during the hospital encounter of 03/02/15 (from the past 24 hour(s))  CBC     Status: Abnormal   Collection Time: 03/02/15  1:00 AM  Result Value Ref Range   WBC 10.6 (H) 4.0 - 10.5 K/uL   RBC 4.36 3.87 - 5.11 MIL/uL   Hemoglobin 12.3 12.0 - 15.0 g/dL   HCT 78.2 95.6 - 21.3 %   MCV 84.6 78.0 - 100.0 fL   MCH 28.2 26.0 - 34.0 pg   MCHC 33.3 30.0 - 36.0 g/dL   RDW 08.6 57.8 - 46.9 %   Platelets 227 150 - 400 K/uL  Comprehensive metabolic panel     Status: Abnormal   Collection Time: 03/02/15  1:00 AM  Result Value Ref Range   Sodium 133 (L) 135 - 145 mmol/L   Potassium 4.3 3.5 - 5.1 mmol/L   Chloride 103 96 - 112 mmol/L   CO2 18 (L) 19 - 32  mmol/L   Glucose, Bld 98 70 - 99 mg/dL   BUN 6 6 - 23 mg/dL   Creatinine, Ser 8.290.44 (L) 0.50 - 1.10 mg/dL   Calcium 8.7 8.4 - 56.210.5 mg/dL   Total Protein 7.1 6.0 - 8.3 g/dL   Albumin 3.0 (L) 3.5 - 5.2 g/dL   AST 46 (H) 0 - 37 U/L   ALT 24 0 - 35 U/L   Alkaline Phosphatase 187 (H) 39 - 117 U/L   Total Bilirubin 1.5 (H) 0.3 - 1.2 mg/dL   GFR calc non Af Amer >90 >90 mL/min   GFR calc Af Amer >90 >90 mL/min   Anion gap 12 5 - 15   0307: Patient's fever has decreased at this time, and she is afebrile. Will complete IV fluids and then DC home with tamiflu.  FHT 145, moderate with 15x15 accels, no decels Toco: irregular UCs   Assessment and Plan   1. Influenza caused by unspecified influenza virus    DC home Flu test pending Rx  tamiflu Comfort measures reviewed  Follow-up Information    Follow up with Kathreen CosierMARSHALL,BERNARD A, MD.   Specialty:  Obstetrics and Gynecology   Why:  As needed, As scheduled   Contact information:   [address] [place] [address] [phone]       Tawnya CrookHogan, Heather Donovan 03/02/2015, 1:05 AM

## 2015-03-02 NOTE — MAU Note (Signed)
Pt states she has had a cough since Tuesday and Fever started Wednesday morning.

## 2015-03-08 LAB — OB RESULTS CONSOLE GBS: STREP GROUP B AG: NEGATIVE

## 2015-03-27 ENCOUNTER — Inpatient Hospital Stay (HOSPITAL_COMMUNITY): Payer: Medicaid Other | Admitting: Anesthesiology

## 2015-03-27 ENCOUNTER — Inpatient Hospital Stay (HOSPITAL_COMMUNITY)
Admission: AD | Admit: 2015-03-27 | Discharge: 2015-03-28 | DRG: 775 | Disposition: A | Discharge status: Home / Self Care | Payer: Medicaid Other | Source: Ambulatory Visit | Attending: Obstetrics | Admitting: Obstetrics

## 2015-03-27 ENCOUNTER — Encounter (HOSPITAL_COMMUNITY): Payer: Self-pay

## 2015-03-27 DIAGNOSIS — Z3A40 40 weeks gestation of pregnancy: Secondary | ICD-10-CM | POA: Diagnosis present

## 2015-03-27 LAB — RAPID HIV SCREEN (HIV 1/2 AB+AG)
HIV 1/2 Antibodies: NONREACTIVE
HIV-1 P24 Antigen - HIV24: NONREACTIVE

## 2015-03-27 LAB — CBC
HEMATOCRIT: 39.9 % (ref 36.0–46.0)
HEMOGLOBIN: 13.6 g/dL (ref 12.0–15.0)
MCH: 28 pg (ref 26.0–34.0)
MCHC: 34.1 g/dL (ref 30.0–36.0)
MCV: 82.3 fL (ref 78.0–100.0)
Platelets: 224 10*3/uL (ref 150–400)
RBC: 4.85 MIL/uL (ref 3.87–5.11)
RDW: 15.6 % — AB (ref 11.5–15.5)
WBC: 12.4 10*3/uL — AB (ref 4.0–10.5)

## 2015-03-27 LAB — TYPE AND SCREEN
ABO/RH(D): O POS
Antibody Screen: NEGATIVE

## 2015-03-27 MED ORDER — ONDANSETRON HCL 4 MG/2ML IJ SOLN: 4.0000 mg | INTRAMUSCULAR | Status: DC | PRN

## 2015-03-27 MED ORDER — CITRIC ACID-SODIUM CITRATE 334-500 MG/5ML PO SOLN: 30.0000 mL | ORAL | Status: DC | PRN

## 2015-03-27 MED ORDER — EPHEDRINE 5 MG/ML INJ
10.0000 mg | INTRAVENOUS | Status: DC | PRN
  Filled 2015-03-27: qty 2

## 2015-03-27 MED ORDER — SENNOSIDES-DOCUSATE SODIUM 8.6-50 MG PO TABS
2.0000 | ORAL_TABLET | ORAL | Status: DC
  Administered 2015-03-28: 2 via ORAL
  Filled 2015-03-27: qty 2

## 2015-03-27 MED ORDER — OXYTOCIN BOLUS FROM INFUSION: 500.0000 mL | INTRAVENOUS | Status: DC

## 2015-03-27 MED ORDER — BENZOCAINE-MENTHOL 20-0.5 % EX AERO
1.0000 | INHALATION_SPRAY | CUTANEOUS | Status: DC | PRN
Start: 2015-03-27 — End: 2015-03-28

## 2015-03-27 MED ORDER — OXYCODONE-ACETAMINOPHEN 5-325 MG PO TABS: 2.0000 | ORAL_TABLET | ORAL | Status: DC | PRN

## 2015-03-27 MED ORDER — ACETAMINOPHEN 325 MG PO TABS: 650.0000 mg | ORAL_TABLET | ORAL | Status: DC | PRN

## 2015-03-27 MED ORDER — IBUPROFEN 600 MG PO TABS
600.0000 mg | ORAL_TABLET | Freq: Four times a day (QID) | ORAL | Status: DC
  Administered 2015-03-27 – 2015-03-28 (×5): 600 mg via ORAL
  Filled 2015-03-27 (×5): qty 1

## 2015-03-27 MED ORDER — PHENYLEPHRINE 40 MCG/ML (10ML) SYRINGE FOR IV PUSH (FOR BLOOD PRESSURE SUPPORT)
80.0000 ug | PREFILLED_SYRINGE | INTRAVENOUS | Status: DC | PRN
  Filled 2015-03-27: qty 2
  Filled 2015-03-27: qty 20

## 2015-03-27 MED ORDER — DIPHENHYDRAMINE HCL 25 MG PO CAPS: 25.0000 mg | ORAL_CAPSULE | Freq: Four times a day (QID) | ORAL | Status: DC | PRN

## 2015-03-27 MED ORDER — DIBUCAINE 1 % RE OINT: 1.0000 "application " | TOPICAL_OINTMENT | RECTAL | Status: DC | PRN

## 2015-03-27 MED ORDER — TETANUS-DIPHTH-ACELL PERTUSSIS 5-2.5-18.5 LF-MCG/0.5 IM SUSP: 0.5000 mL | Freq: Once | INTRAMUSCULAR | Status: DC

## 2015-03-27 MED ORDER — BUTORPHANOL TARTRATE 1 MG/ML IJ SOLN
1.0000 mg | INTRAMUSCULAR | Status: DC | PRN
  Administered 2015-03-27: 1 mg via INTRAVENOUS
  Filled 2015-03-27: qty 1

## 2015-03-27 MED ORDER — LACTATED RINGERS IV SOLN
500.0000 mL | Freq: Once | INTRAVENOUS | Status: AC
  Administered 2015-03-27: 500 mL via INTRAVENOUS

## 2015-03-27 MED ORDER — ONDANSETRON HCL 4 MG/2ML IJ SOLN: 4.0000 mg | Freq: Four times a day (QID) | INTRAMUSCULAR | Status: DC | PRN

## 2015-03-27 MED ORDER — ONDANSETRON HCL 4 MG PO TABS: 4.0000 mg | ORAL_TABLET | ORAL | Status: DC | PRN

## 2015-03-27 MED ORDER — LANOLIN HYDROUS EX OINT: TOPICAL_OINTMENT | CUTANEOUS | Status: DC | PRN

## 2015-03-27 MED ORDER — ZOLPIDEM TARTRATE 5 MG PO TABS: 5.0000 mg | ORAL_TABLET | Freq: Every evening | ORAL | Status: DC | PRN

## 2015-03-27 MED ORDER — FENTANYL 2.5 MCG/ML BUPIVACAINE 1/10 % EPIDURAL INFUSION (WH - ANES)
14.0000 mL/h | INTRAMUSCULAR | Status: DC | PRN
  Filled 2015-03-27: qty 125

## 2015-03-27 MED ORDER — OXYCODONE-ACETAMINOPHEN 5-325 MG PO TABS: 1.0000 | ORAL_TABLET | ORAL | Status: DC | PRN

## 2015-03-27 MED ORDER — LIDOCAINE HCL (PF) 1 % IJ SOLN
30.0000 mL | INTRAMUSCULAR | Status: DC | PRN
  Filled 2015-03-27: qty 30

## 2015-03-27 MED ORDER — PHENYLEPHRINE 40 MCG/ML (10ML) SYRINGE FOR IV PUSH (FOR BLOOD PRESSURE SUPPORT)
80.0000 ug | PREFILLED_SYRINGE | INTRAVENOUS | Status: DC | PRN
  Filled 2015-03-27: qty 2

## 2015-03-27 MED ORDER — FENTANYL 2.5 MCG/ML BUPIVACAINE 1/10 % EPIDURAL INFUSION (WH - ANES)
INTRAMUSCULAR | Status: DC | PRN
  Administered 2015-03-27: 14 mL/h via EPIDURAL

## 2015-03-27 MED ORDER — DIPHENHYDRAMINE HCL 50 MG/ML IJ SOLN: 12.5000 mg | INTRAMUSCULAR | Status: DC | PRN

## 2015-03-27 MED ORDER — SIMETHICONE 80 MG PO CHEW: 80.0000 mg | CHEWABLE_TABLET | ORAL | Status: DC | PRN

## 2015-03-27 MED ORDER — LACTATED RINGERS IV SOLN: 500.0000 mL | INTRAVENOUS | Status: DC | PRN

## 2015-03-27 MED ORDER — LACTATED RINGERS IV SOLN: INTRAVENOUS | Status: DC

## 2015-03-27 MED ORDER — FERROUS SULFATE 325 (65 FE) MG PO TABS
325.0000 mg | ORAL_TABLET | Freq: Two times a day (BID) | ORAL | Status: DC
  Administered 2015-03-28: 325 mg via ORAL
  Filled 2015-03-27 (×2): qty 1

## 2015-03-27 MED ORDER — WITCH HAZEL-GLYCERIN EX PADS: 1.0000 "application " | MEDICATED_PAD | CUTANEOUS | Status: DC | PRN

## 2015-03-27 MED ORDER — OXYTOCIN 40 UNITS IN LACTATED RINGERS INFUSION - SIMPLE MED
62.5000 mL/h | INTRAVENOUS | Status: DC
  Administered 2015-03-27: 62.5 mL/h via INTRAVENOUS
  Filled 2015-03-27: qty 1000

## 2015-03-27 MED ORDER — LIDOCAINE HCL (PF) 1 % IJ SOLN
INTRAMUSCULAR | Status: DC | PRN
  Administered 2015-03-27 (×2): 8 mL

## 2015-03-27 MED ORDER — PRENATAL MULTIVITAMIN CH
1.0000 | ORAL_TABLET | Freq: Every day | ORAL | Status: DC
  Administered 2015-03-28: 1 via ORAL
  Filled 2015-03-27: qty 1

## 2015-03-27 NOTE — MAU Note (Signed)
Pt states via Eda, spanish interpreter that ctx's are q8 minutes apart. No lof. Bloody show.

## 2015-03-27 NOTE — Anesthesia Preprocedure Evaluation (Signed)

## 2015-03-27 NOTE — MAU Note (Signed)
Urine in lab 

## 2015-03-27 NOTE — Progress Notes (Signed)
Delivery of live viable female by Dr Marshall. APGARS 8,9  

## 2015-03-27 NOTE — Progress Notes (Signed)
I was present with Dr Gaynell FaceMarshall during the delivery and I assisted Lauren RN with some questions, I also ordered patient's dinner, by Orlan LeavensViria Alvarez Spanish Interpreter

## 2015-03-27 NOTE — H&P (Signed)
This is Dr. Francoise CeoBernard Marshall dictating the history and physical on  Amber Jacobson  she is a 29 year old gravida 3 para 1 12/23/1936 weeks EDC 03/27/2015 negative GBS admitted in labor 4 cm on admission and when she reached labor and delivery she was 9 cm her membranes ruptured spontaneously at fluid clear she rapidly became fully dilated and had a normal vaginal delivery of a female Apgar 6189 placenta spontaneous   first-degree perineal repaired with 2-0 Vicryl Past medical history negative Past surgical history negative Social history negative System review negative Physical exam well-developed female postpartum HEENT negative Lungs clear to P&A Heart regular rhythm no murmurs no gallops Breasts negative Abdomen uterus 20 week postpartum size Pelvic as described above Extremities negative

## 2015-03-27 NOTE — Progress Notes (Signed)
Notified of pt's cervical exam, ctx pattern, orders to admit.

## 2015-03-27 NOTE — Lactation Note (Signed)
This note was copied from the chart of Amber Jacobson. Lactation Consultation Note Initial visit at 5 hours of age.  Mom reports needing assist with latching.  [name] available to interpret in spanish.  Babyis swaddled tightly in double blankets.  Encouraged STS, mom has easily expressed colostrum.  Mom attempts cradle hold.  Assisted with cross cradle hold to allow for a deep latch with a wide open mouth.  Encouraged mom to hold baby close to breast with nose, chin, cheeks against breast.  Mom denies pain.  Baby has strong rhythmic sucking bursts.  Baby has had 1 bottle and 1 breast feeding.  Encouraged mom to exclusively breastfeed and discussed reasons she does not want to use formula right now.  Mom reports a history of low milk supply, but reports breast changes with this pregnancy.  [facility] given and discussed.  Encouraged to feed with early cues on demand.  Early newborn behavior discussed.  Mom to call for assist as needed.    Patient Name: Amber Jacobson Today's Date: 03/27/2015 Reason for consult: Initial assessment   Maternal Data Has patient been taught Hand Expression?: Yes Does the patient have breastfeeding experience prior to this delivery?: Yes  Feeding Feeding Type: Breast Fed Length of feed:  (observed 10 minutes)  LATCH Score/Interventions Latch: Grasps breast easily, tongue down, lips flanged, rhythmical sucking. Intervention(s): Adjust position;Assist with latch;Breast massage;Breast compression  Audible Swallowing: A few with stimulation Intervention(s): Skin to skin;Hand expression;Alternate breast massage  Type of Nipple: Everted at rest and after stimulation  Comfort (Breast/Nipple): Soft / non-tender     Hold (Positioning): Assistance needed to correctly position infant at breast and maintain latch. Intervention(s): Breastfeeding basics reviewed;Support Pillows;Position options;Skin to skin  LATCH Score: 8  Lactation Tools  Discussed/Used WIC Program: Yes   Consult Status Consult Status: Follow-up Date: 03/21/15 Follow-up type: In-patient    [name], Arvella MerlesJana Lynn 03/27/2015, 8:57 PM

## 2015-03-27 NOTE — Anesthesia Procedure Notes (Signed)
Epidural Patient location during procedure: OB Start time: 03/27/2015 1:25 PM End time: 03/27/2015 1:29 PM  Staffing Anesthesiologist: Leilani AbleHATCHETT, Aayan Haskew Performed by: anesthesiologist   Preanesthetic Checklist Completed: patient identified, surgical consent, pre-op evaluation, timeout performed, IV checked, risks and benefits discussed and monitors and equipment checked  Epidural Patient position: sitting Prep: site prepped and draped and DuraPrep Patient monitoring: continuous pulse ox and blood pressure Approach: midline Location: L3-L4 Injection technique: LOR air  Needle:  Needle type: Tuohy  Needle gauge: 17 G Needle length: 9 cm and 9 Needle insertion depth: 5 cm cm Catheter type: closed end flexible Catheter size: 19 Gauge Catheter at skin depth: 10 cm Test dose: negative and Other  Assessment Sensory level: T9 Events: blood not aspirated, injection not painful, no injection resistance, negative IV test and no paresthesia  Additional Notes Reason for block:procedure for pain

## 2015-03-28 LAB — CBC
HCT: 34.1 % — ABNORMAL LOW (ref 36.0–46.0)
HEMOGLOBIN: 11.2 g/dL — AB (ref 12.0–15.0)
MCH: 27.5 pg (ref 26.0–34.0)
MCHC: 32.8 g/dL (ref 30.0–36.0)
MCV: 83.8 fL (ref 78.0–100.0)
Platelets: 222 10*3/uL (ref 150–400)
RBC: 4.07 MIL/uL (ref 3.87–5.11)
RDW: 16.1 % — AB (ref 11.5–15.5)
WBC: 12.5 10*3/uL — ABNORMAL HIGH (ref 4.0–10.5)

## 2015-03-28 LAB — RPR: RPR: NONREACTIVE

## 2015-03-28 NOTE — Anesthesia Postprocedure Evaluation (Signed)
Anesthesia Post Note  Patient: Amber Jacobson  Procedure(s) Performed: * No procedures listed *  Anesthesia type: Epidural  Patient location: Mother/Baby  Post pain: Pain level controlled  Post assessment: Post-op Vital signs reviewed  Last Vitals:  Filed Vitals:   03/28/15 0507  BP: 128/84  Pulse: 84  Temp: 36.8 C  Resp: 19    Post vital signs: Reviewed  Level of consciousness:alert  Complications: No apparent anesthesia complications

## 2015-03-28 NOTE — Progress Notes (Signed)
I stopped to check on patient's need, I ordered her dinner, snack, and breakfast, also I assisted RN with some discharges instructions for mom , baby continued as a patient, by Orlan LeavensViria Alvarez Spanish Interpreter

## 2015-03-28 NOTE — Progress Notes (Signed)
UR chart review completed.  

## 2015-03-28 NOTE — Discharge Instructions (Signed)
Discharge instructions   You can wash your hair  Shower  Eat what you want  Drink what you want  See me in 6 weeks  Your ankles are going to swell more in the next 2 weeks than when pregnant  No sex for 6 weeks   Jarrod Bodkins A, MD 03/28/2015

## 2015-03-28 NOTE — Discharge Summary (Signed)
Obstetric Discharge Summary Reason for Admission: onset of labor Prenatal Procedures: none Intrapartum Procedures: spontaneous vaginal delivery Postpartum Procedures: none Complications-Operative and Postpartum: none HEMOGLOBIN  Date Value Ref Range Status  03/27/2015 13.6 12.0 - 15.0 g/dL Final   HCT  Date Value Ref Range Status  03/27/2015 39.9 36.0 - 46.0 % Final    Physical Exam:  General: alert Lochia: appropriate Uterine Fundus: firm Incision: healing well DVT Evaluation: No evidence of DVT seen on physical exam.  Discharge Diagnoses: Term Pregnancy-delivered  Discharge Information: Date: 03/28/2015 Activity: pelvic rest Diet: routine Medications: Percocet Condition: stable Instructions: refer to practice specific booklet Discharge to: home Follow-up Information    Follow up with Kathreen CosierMARSHALL,BERNARD A, MD.   Specialty:  Obstetrics and Gynecology   Contact information:   [address] [place] [address] [phone]       Newborn Data: Live born female  Birth Weight: 6 lb 12 oz (3062 g) APGAR: 8, 9  Home with mother.  MARSHALL,BERNARD A 03/28/2015, 6:14 AM

## 2015-03-29 ENCOUNTER — Ambulatory Visit: Payer: Self-pay

## 2015-03-29 NOTE — Lactation Note (Signed)
This note was copied from the chart of Amber Jacobson. Lactation Consultation Note. Mom bottle feeding formula when I went into room. [place]- CNA  interpreted for me, Mom reports breast are feeling a little fuller this morning. Encouraged to always breast feed first then give formula if baby is still hungry. Reviewed supply and demand to promote a good milk supply. Reports baby latched on well. No questions at present. To call prn  Patient Name: Amber Jacobson [id number]'s Date: 03/29/2015 Reason for consult: Follow-up assessment   Maternal Data    Feeding   LATCH Score/Interventions                      Lactation Tools Discussed/Used     Consult Status Consult Status: Complete    Pamelia HoitWeeks, Anijah Spohr D 03/29/2015, 10:30 AM

## 2022-04-27 ENCOUNTER — Encounter (HOSPITAL_BASED_OUTPATIENT_CLINIC_OR_DEPARTMENT_OTHER): Payer: Self-pay

## 2022-04-27 ENCOUNTER — Emergency Department (HOSPITAL_BASED_OUTPATIENT_CLINIC_OR_DEPARTMENT_OTHER): Payer: No Typology Code available for payment source

## 2022-04-27 ENCOUNTER — Other Ambulatory Visit: Payer: Self-pay

## 2022-04-27 ENCOUNTER — Emergency Department (HOSPITAL_BASED_OUTPATIENT_CLINIC_OR_DEPARTMENT_OTHER)
Admission: EM | Admit: 2022-04-27 | Discharge: 2022-04-27 | Disposition: A | Discharge status: Home / Self Care | Payer: No Typology Code available for payment source | Attending: Emergency Medicine | Admitting: Emergency Medicine

## 2022-04-27 DIAGNOSIS — Y92481 Parking lot as the place of occurrence of the external cause: Secondary | ICD-10-CM | POA: Insufficient documentation

## 2022-04-27 DIAGNOSIS — H532 Diplopia: Secondary | ICD-10-CM | POA: Insufficient documentation

## 2022-04-27 DIAGNOSIS — S80811A Abrasion, right lower leg, initial encounter: Secondary | ICD-10-CM | POA: Diagnosis not present

## 2022-04-27 DIAGNOSIS — S8991XA Unspecified injury of right lower leg, initial encounter: Secondary | ICD-10-CM | POA: Diagnosis present

## 2022-04-27 DIAGNOSIS — R519 Headache, unspecified: Secondary | ICD-10-CM | POA: Diagnosis not present

## 2022-04-27 DIAGNOSIS — M25561 Pain in right knee: Secondary | ICD-10-CM | POA: Diagnosis not present

## 2022-04-27 DIAGNOSIS — M25571 Pain in right ankle and joints of right foot: Secondary | ICD-10-CM | POA: Diagnosis not present

## 2022-04-27 MED ORDER — IBUPROFEN 400 MG PO TABS
600.0000 mg | ORAL_TABLET | Freq: Once | ORAL | Status: AC
  Administered 2022-04-27: 600 mg via ORAL
  Filled 2022-04-27: qty 1

## 2022-04-27 NOTE — ED Notes (Signed)
Pt provided discharge instructions and prescription information. Pt was given the opportunity to ask questions and questions were answered.   
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
## 2022-12-06 IMAGING — CT CT HEAD W/O CM
4 series · 16 of 47 positions shown, 18 images · non-contrast
Comparison: None Available.

CLINICAL DATA: Head trauma



[Series 2: head wo · axial · 0.45mm/px · z∈[+1292,+1412]mm · 7 of 33 slices shown, 9 images]
[im 5/33  brain]
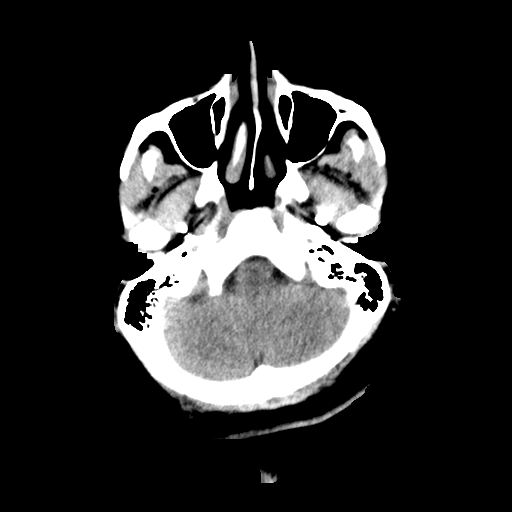
[im 5/33  bone]
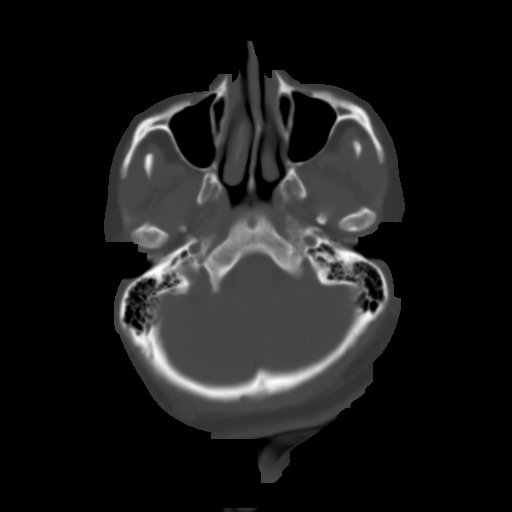
[im 9/33  brain]
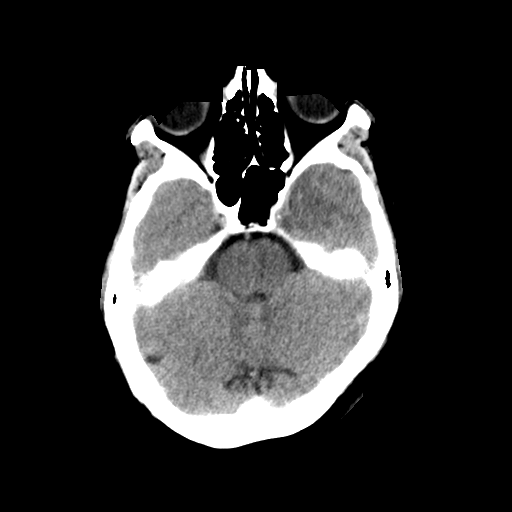
[im 13/33  brain]
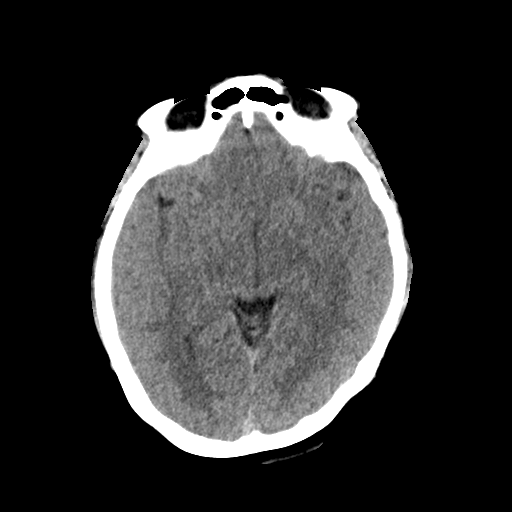
[im 17/33  brain]
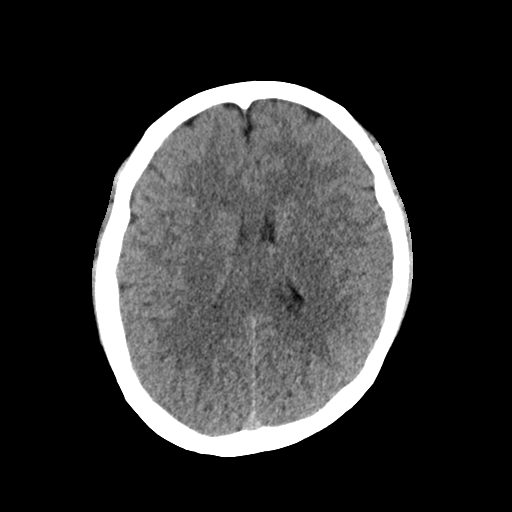
[im 21/33  brain]
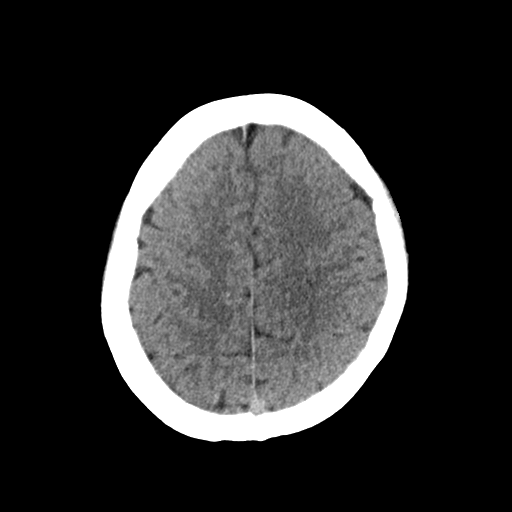
[im 21/33  bone]
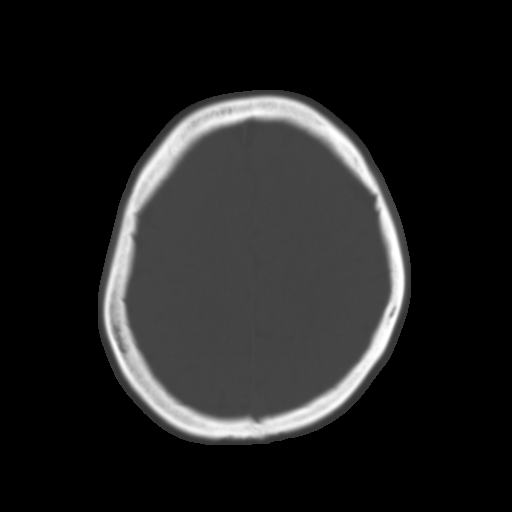
[im 25/33  brain]
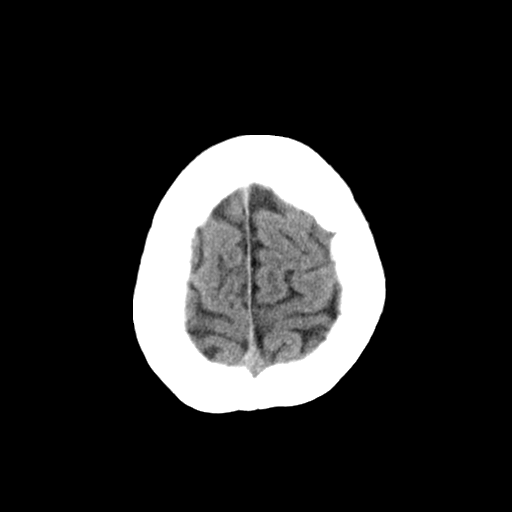
[im 29/33  brain]
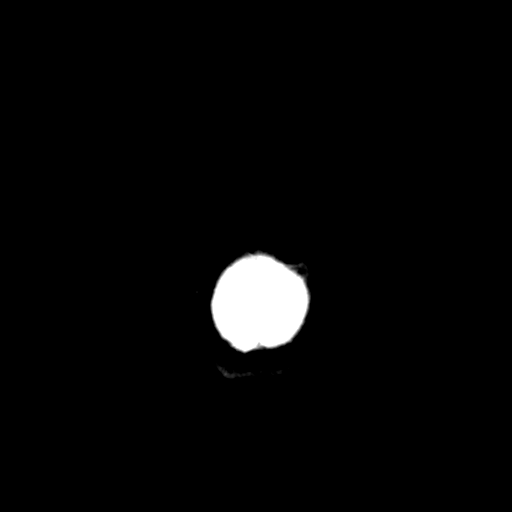

[Series 3: head bone · axial · 0.45mm/px · z∈[+1288,+1320]mm · 3 of 83 slices shown]
[im 9/83  bone]
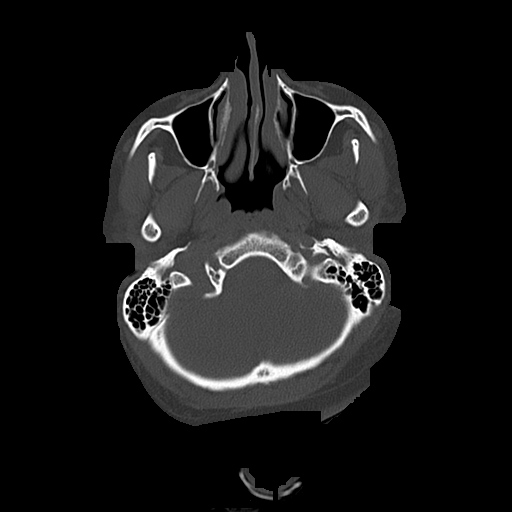
[im 17/83  bone]
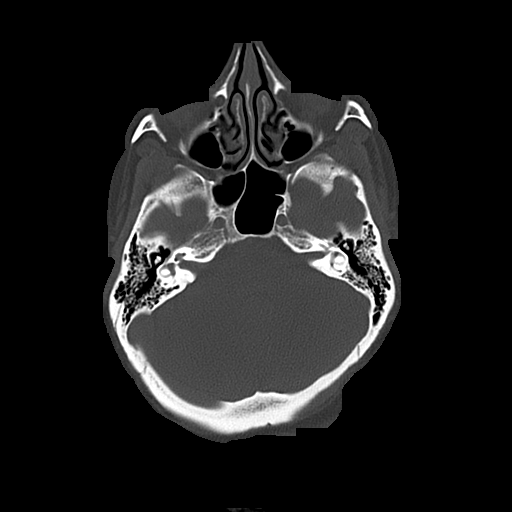
[im 25/83  bone]
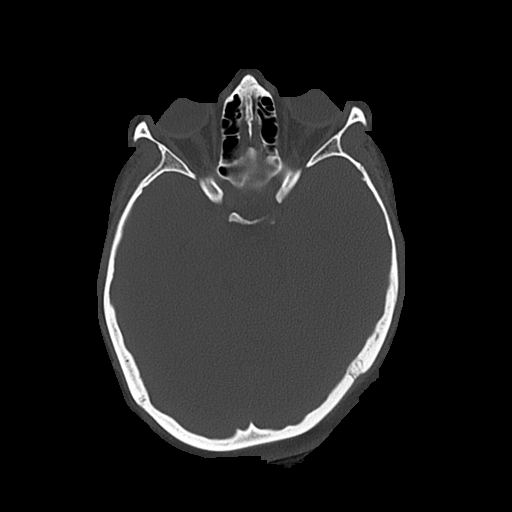

[Series 4: coronal soft · coronal · 0.31mm/px · 3 of 61 slices shown]
[im 21/61  brain]
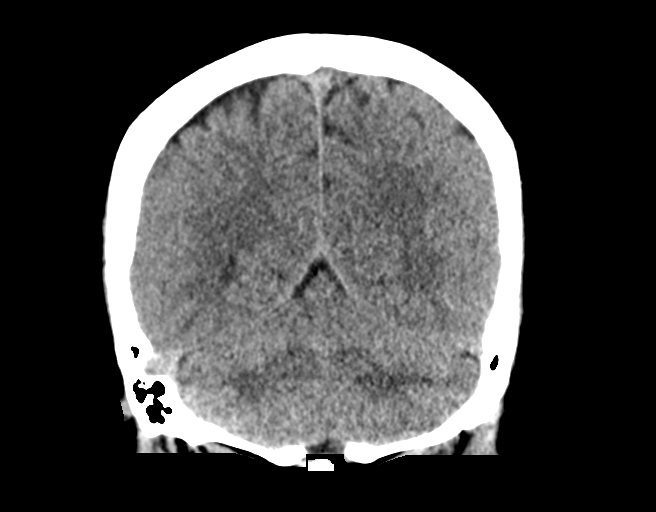
[im 27/61  brain]
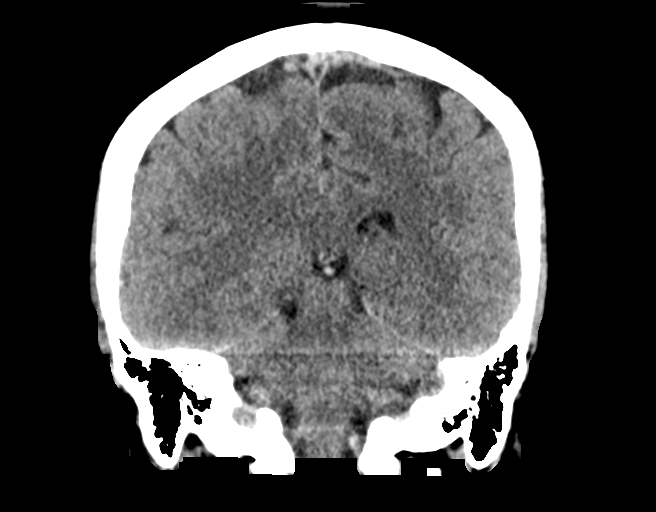
[im 34/61  brain]
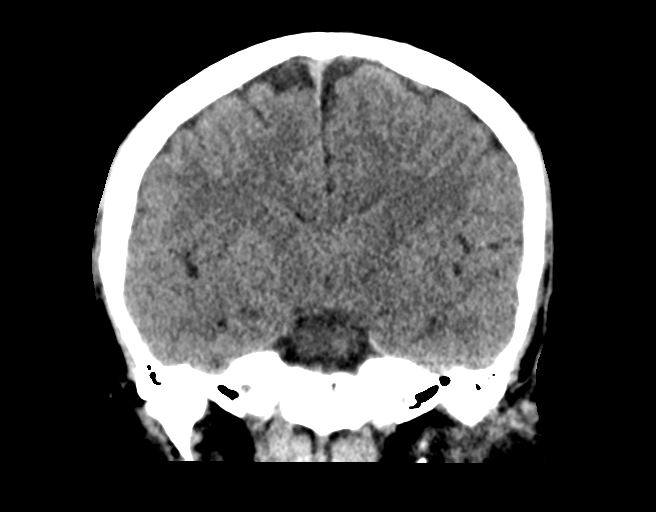

[Series 5: sagittal soft · sagittal · 0.31mm/px · 3 of 52 slices shown]
[im 18/52  brain]
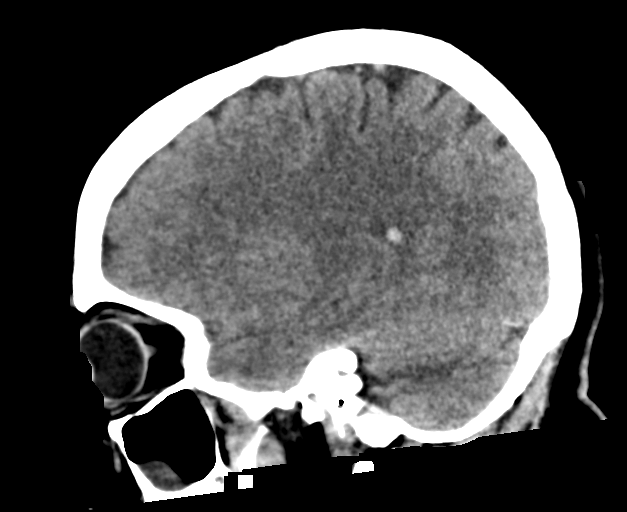
[im 26/52  brain]
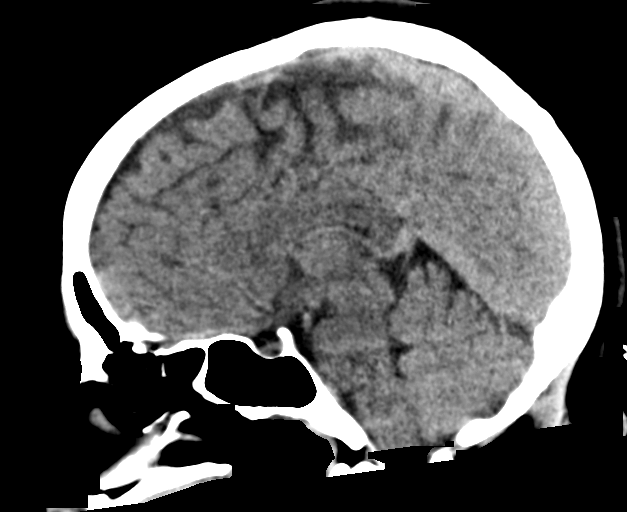
[im 35/52  brain]
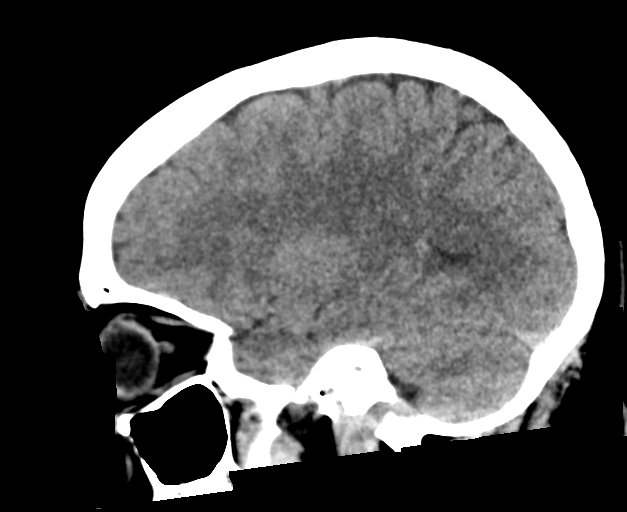

[16 of 47 positions shown; findings below may reference images not displayed]

FINDINGS: Brain: No evidence of acute infarction, hemorrhage, hydrocephalus,
extra-axial collection or mass lesion/mass effect.

Vascular: No hyperdense vessel or unexpected calcification.

Skull: Normal. Negative for fracture or focal lesion.

Sinuses/Orbits: No acute finding.

Other: None.
IMPRESSION: No acute intracranial abnormality

## 2024-11-30 ENCOUNTER — Emergency Department (HOSPITAL_COMMUNITY): Payer: Self-pay

## 2024-11-30 ENCOUNTER — Encounter (HOSPITAL_COMMUNITY): Payer: Self-pay | Admitting: Emergency Medicine

## 2024-11-30 ENCOUNTER — Other Ambulatory Visit: Payer: Self-pay

## 2024-11-30 ENCOUNTER — Emergency Department (HOSPITAL_COMMUNITY)
Admission: EM | Admit: 2024-11-30 | Discharge: 2024-11-30 | Disposition: A | Discharge status: Home / Self Care | Payer: Self-pay | Attending: Emergency Medicine | Admitting: Emergency Medicine

## 2024-11-30 DIAGNOSIS — S134XXA Sprain of ligaments of cervical spine, initial encounter: Secondary | ICD-10-CM

## 2024-11-30 LAB — PREGNANCY, URINE: Preg Test, Ur: NEGATIVE

## 2024-11-30 MED ORDER — ACETAMINOPHEN 500 MG PO TABS
1000.0000 mg | ORAL_TABLET | Freq: Once | ORAL | Status: AC
  Administered 2024-11-30: 1000 mg via ORAL
  Filled 2024-11-30: qty 2

## 2024-11-30 MED ORDER — IBUPROFEN 400 MG PO TABS
400.0000 mg | ORAL_TABLET | Freq: Once | ORAL | Status: AC
  Administered 2024-11-30: 400 mg via ORAL
  Filled 2024-11-30: qty 1

## 2024-11-30 NOTE — ED Notes (Signed)
 Pt ambulatory to bathroom

## 2024-11-30 NOTE — ED Notes (Signed)
 Patient transported to X-ray
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
